# Patient Record
Sex: Male | Born: 1970 | Hispanic: No | Marital: Married | Smoking: Never smoker
Health system: Southern US, Community
[De-identification: ages and names within clinical notes are randomized; demographics above are authoritative.]

---

## 2009-07-26 ENCOUNTER — Emergency Department (HOSPITAL_COMMUNITY): Admission: EM | Admit: 2009-07-26 | Discharge: 2009-07-26 | Payer: Self-pay | Admitting: Emergency Medicine

## 2010-05-07 LAB — POCT CARDIAC MARKERS
CKMB, poc: <1 ng/mL — ABNORMAL LOW (ref 1.0–8.0)
Myoglobin, poc: 123 ng/mL (ref 12–200)
Troponin i, poc: <0.05 ng/mL (ref 0.00–0.09)

## 2010-05-07 LAB — POCT I-STAT, CHEM 8
BUN: 12 mg/dL (ref 6–23)
Calcium, Ion: 1.12 mmol/L (ref 1.12–1.32)
Creatinine, Ser: 1 mg/dL (ref 0.4–1.5)
HCT: 47 % (ref 39.0–52.0)
Potassium: 4 mEq/L (ref 3.5–5.1)
Sodium: 140 mEq/L (ref 135–145)

## 2011-07-16 IMAGING — CR DG CHEST 2V
2 series · 2 of 2 positions shown · non-contrast
Comparison: None

CLINICAL DATA: Chest pain.

CHEST - 2 VIEW

[w chest pa]
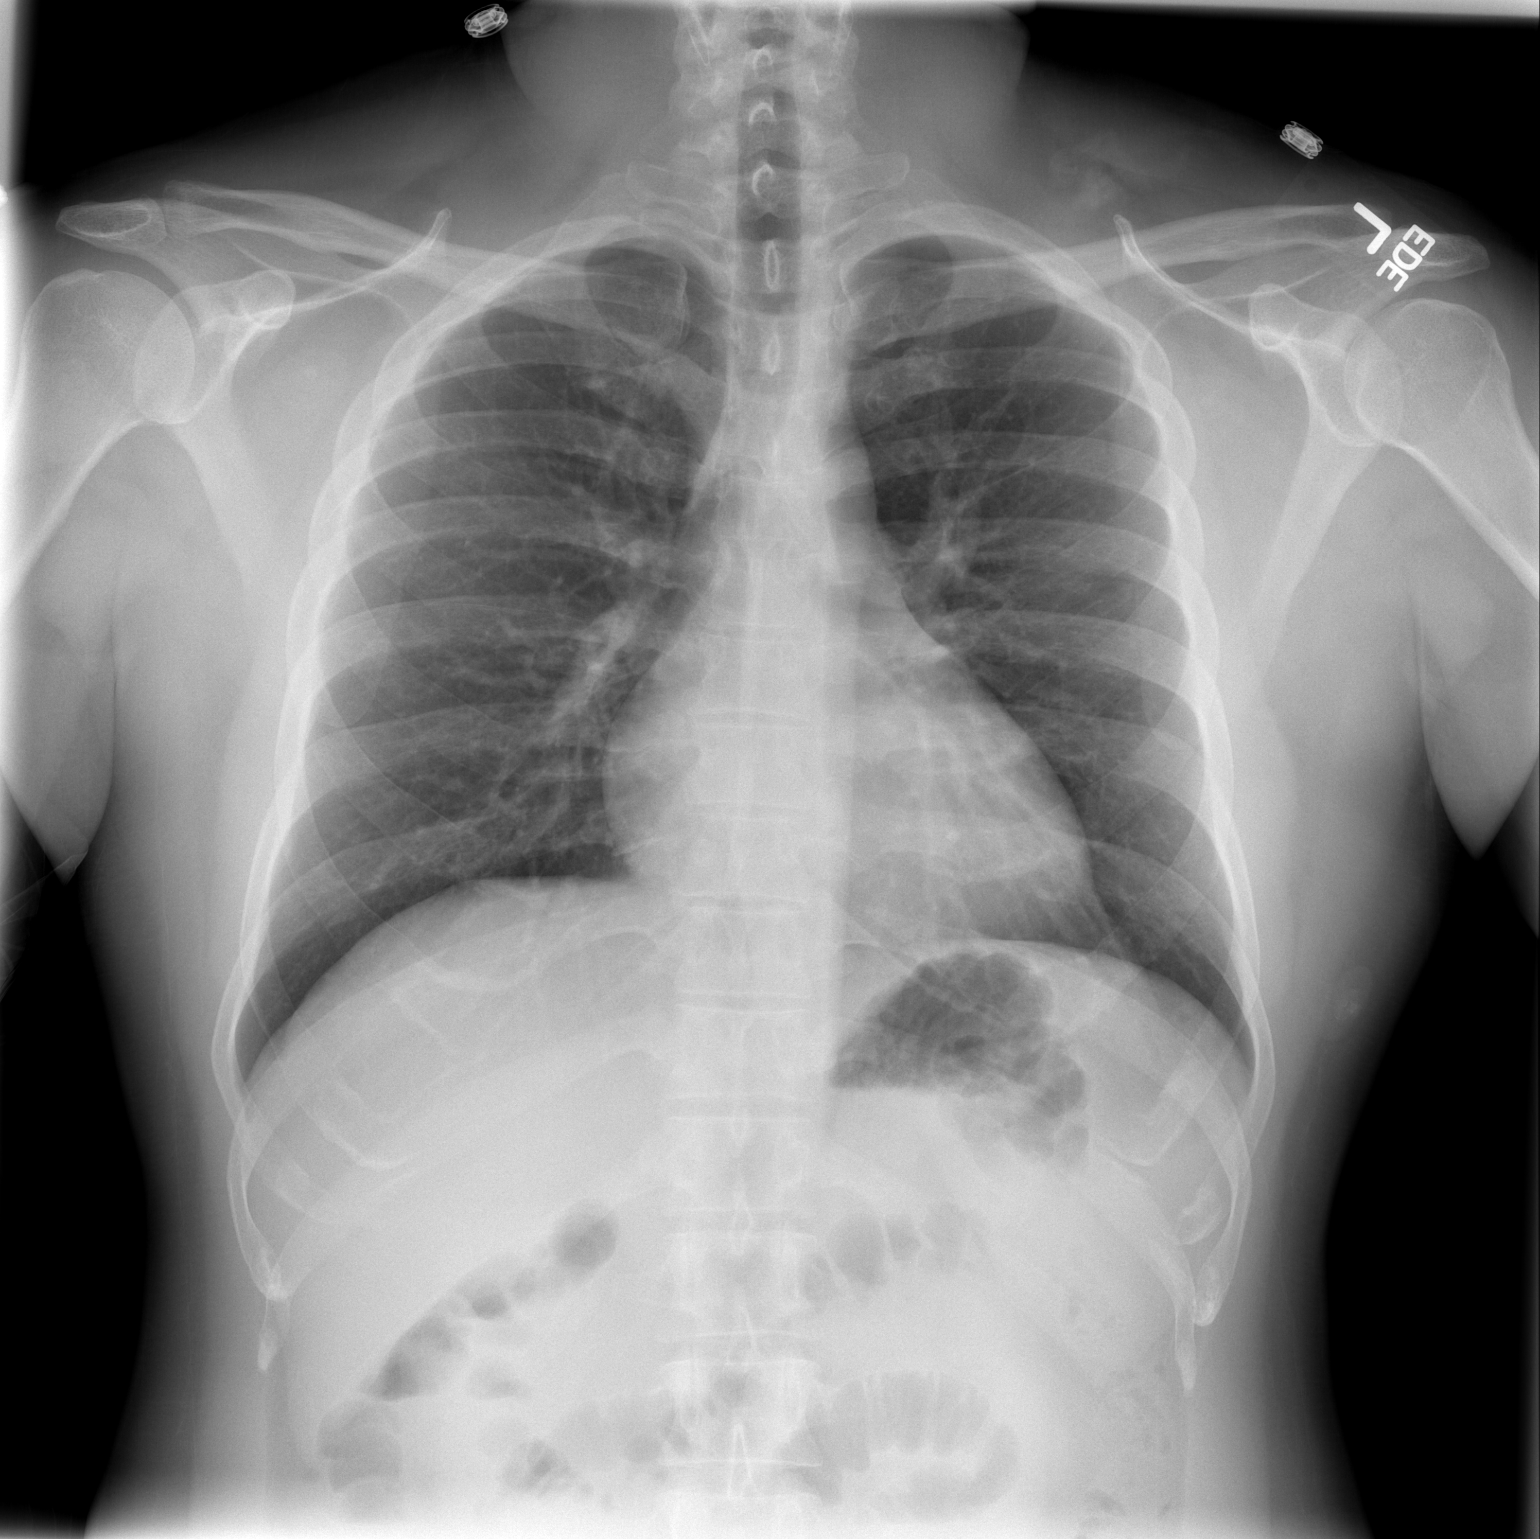

[w chest lat]
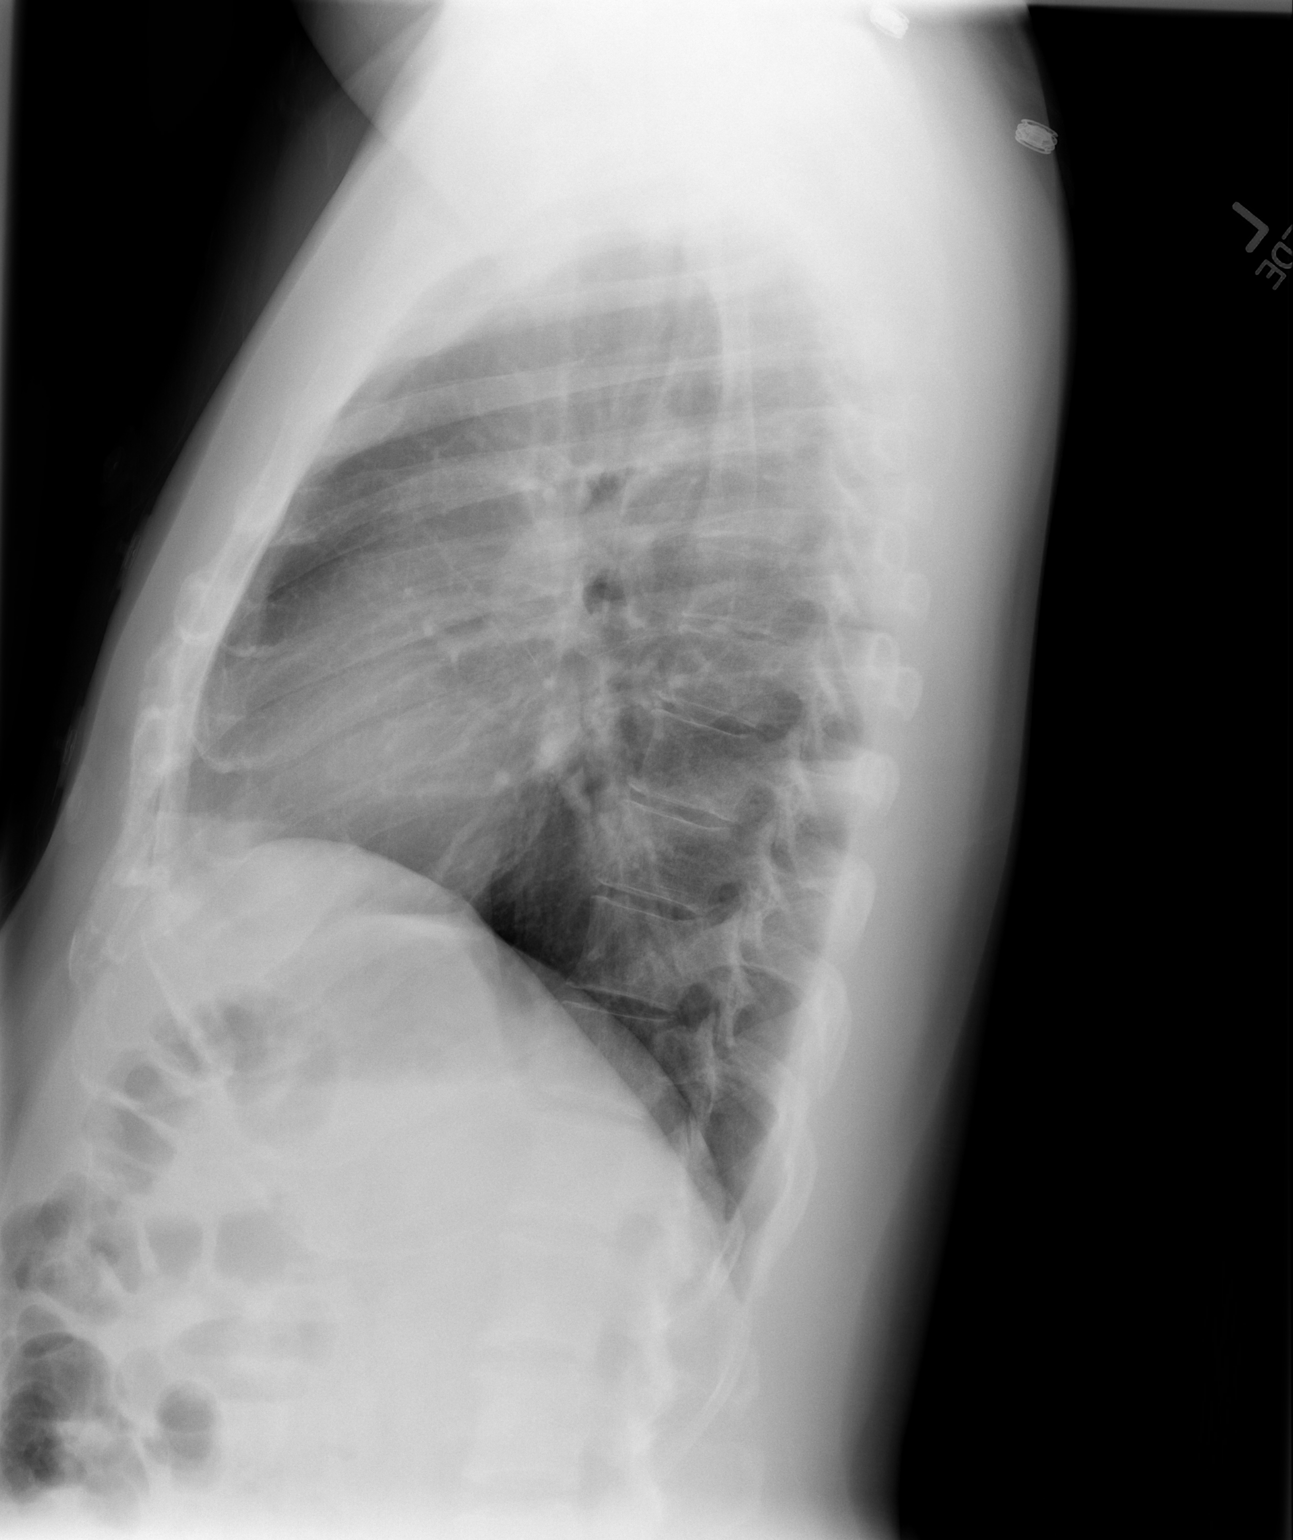

[2 of 2 positions shown; findings below may reference images not displayed]

FINDINGS: The heart size and mediastinal contours are within normal
limits.  Both lungs are clear.  The visualized skeletal structures
are unremarkable.
IMPRESSION: No active disease.

## 2014-03-12 ENCOUNTER — Encounter (HOSPITAL_COMMUNITY): Payer: Self-pay | Admitting: Nurse Practitioner

## 2014-03-12 ENCOUNTER — Emergency Department (HOSPITAL_COMMUNITY)
Admission: EM | Admit: 2014-03-12 | Discharge: 2014-03-12 | Disposition: A | Discharge status: Home / Self Care | Payer: BLUE CROSS/BLUE SHIELD | Attending: Emergency Medicine | Admitting: Emergency Medicine

## 2014-03-12 ENCOUNTER — Emergency Department (HOSPITAL_COMMUNITY): Payer: BLUE CROSS/BLUE SHIELD

## 2014-03-12 DIAGNOSIS — Y288XXA Contact with other sharp object, undetermined intent, initial encounter: Secondary | ICD-10-CM | POA: Diagnosis not present

## 2014-03-12 DIAGNOSIS — S61219A Laceration without foreign body of unspecified finger without damage to nail, initial encounter: Secondary | ICD-10-CM

## 2014-03-12 DIAGNOSIS — Y998 Other external cause status: Secondary | ICD-10-CM | POA: Insufficient documentation

## 2014-03-12 DIAGNOSIS — Z23 Encounter for immunization: Secondary | ICD-10-CM | POA: Diagnosis not present

## 2014-03-12 DIAGNOSIS — Y9389 Activity, other specified: Secondary | ICD-10-CM | POA: Insufficient documentation

## 2014-03-12 DIAGNOSIS — S61213A Laceration without foreign body of left middle finger without damage to nail, initial encounter: Secondary | ICD-10-CM | POA: Insufficient documentation

## 2014-03-12 DIAGNOSIS — Y9289 Other specified places as the place of occurrence of the external cause: Secondary | ICD-10-CM | POA: Diagnosis not present

## 2014-03-12 MED ORDER — LIDOCAINE HCL (PF) 1 % IJ SOLN
10.0000 mL | Freq: Once | INTRAMUSCULAR | Status: AC
  Administered 2014-03-12: 10 mL
  Filled 2014-03-12: qty 10

## 2014-03-12 MED ORDER — TETANUS-DIPHTH-ACELL PERTUSSIS 5-2.5-18.5 LF-MCG/0.5 IM SUSP
0.5000 mL | Freq: Once | INTRAMUSCULAR | Status: AC
  Administered 2014-03-12: 0.5 mL via INTRAMUSCULAR
  Filled 2014-03-12: qty 0.5

## 2014-03-12 NOTE — ED Provider Notes (Signed)
CSN: [id number]     Arrival date & time 03/12/14  1413 History  This chart was scribed for non-physician practitioner, Santiago Glad, PA-C working with Flint Melter, MD by Luisa Dago, ED scribe. This patient was seen in room TR07C/TR07C and the patient's care was started at 2:40 PM.    Chief Complaint  Patient presents with  . Laceration   The history is provided by the patient and medical records. No language interpreter was used.   HPI Comments: Bryan Guzman is a 44 y.o. male who presents to the Emergency Department complaining of a laceration to left middle finger that occurred today at 1:30 pm. Pt states that he was using a box cutter when he accidentally cut the proximal portion of his left middle finger. He reports washing the wound with water and has applied neosporin to it. Bleeding is controlled. No paraesthesia. He does not recall the date of his last tetanus vaccine. He has full movement of the finger.       No past medical history on file. No past surgical history on file. No family history on file. History  Substance Use Topics  . Smoking status: Not on file  . Smokeless tobacco: Not on file  . Alcohol Use: Not on file    Review of Systems  Constitutional: Negative for fever and chills.  Respiratory: Negative for shortness of breath and wheezing.   Skin: Positive for wound. Negative for rash.  Neurological: Negative for numbness.      Allergies  Review of patient's allergies indicates not on file.  Home Medications   Prior to Admission medications   Not on File   BP 129/72 mmHg  Pulse 97  Temp(Src) 97.5 F (36.4 C) (Oral)  Resp 18  Ht  (1.6 m)  Wt 150 lb (68.04 kg)  BMI 26.58 kg/m2  SpO2 97%  Physical Exam  Constitutional: He is oriented to person, place, and time. He appears well-developed and well-nourished. No distress.  HENT:  Head: Normocephalic and atraumatic.  Eyes: Conjunctivae and EOM are normal.  Neck: Neck supple.   Cardiovascular: Normal rate, regular rhythm and normal heart sounds.  Exam reveals no gallop and no friction rub.   No murmur heard.  Good capillary refill.   Pulmonary/Chest: Effort normal and breath sounds normal. No respiratory distress. He has no wheezes. He has no rales.  Musculoskeletal: Normal range of motion.  1.5 cm linear lac of the left middle finger in-between the left PIP and MCP along the radial aspect. No active bleeding. Able to fully flex and extend the finger.  Able to put the hand in a fist.    Neurological: He is alert and oriented to person, place, and time.  Muscle strength of the left middle finger is 5/5 at the level of the MCP, PIP, and DIP.   Distal sensation of the left middle finger is intact.   Skin: Skin is warm and dry.  Psychiatric: He has a normal mood and affect. His behavior is normal.  Nursing note and vitals reviewed.   ED Course  Procedures (including critical care time)  DIAGNOSTIC STUDIES: Oxygen Saturation is 97% on RA, normal by my interpretation.    COORDINATION OF CARE: 2:46 PM- Pt advised of plan for treatment and pt agrees.   Imaging Review Dg Finger Middle Left  03/12/2014   CLINICAL DATA:  Finger laceration middle finger  EXAM: LEFT MIDDLE FINGER 2+V  COMPARISON:  None.  FINDINGS: Three views of the left  third finger submitted. No acute fracture or subluxation. No radiopaque foreign body.  IMPRESSION: Negative.   Electronically Signed   By: Natasha MeadLiviu  Pop M.D.   On: 03/12/2014 15:29    LACERATION REPAIR Performed by: Santiago GladLaisure, Earvin Blazier Authorized by: Santiago GladLaisure, Jhaniya Briski Consent: Verbal consent obtained. Risks and benefits: risks, benefits and alternatives were discussed Consent given by: patient Patient identity confirmed: provided demographic data Prepped and Draped in normal sterile fashion Wound explored  Laceration Location: left middle finger  Laceration Length: 1.5 cm  No Foreign Bodies seen or palpated  Anesthesia:  Digital  block  Local anesthetic: lidocaine 2% without epinephrine  Anesthetic total: 5 ml  Irrigation method: syringe Amount of cleaning: standard  Skin closure: 4-0 Prolene  Number of sutures: 3  Technique: simple interrupted  Patient tolerance: Patient tolerated the procedure well with no immediate complications.  MDM   Final diagnoses:  None   Patient presenting with a laceration of his finger.  Xray negative.  No apparent tendon involvement.  Neurovascularly intact.  Tetanus updated in the ED.  Laceration sutured without difficulty.  Stable for discharge.  Return precautions given.     Santiago GladHeather Lela Murfin, PA-C 03/13/14 1657  Flint MelterElliott L Wentz, MD 03/13/14 [phone]

## 2014-03-12 NOTE — ED Notes (Signed)
He lacerated proximal portion of L middle finger with a box cutter. He washed with water and applied neosporin and clean dressing to control bleeding. No active bleeding now. Cms intact

## 2018-04-09 ENCOUNTER — Other Ambulatory Visit: Payer: Self-pay

## 2018-04-09 ENCOUNTER — Ambulatory Visit (HOSPITAL_COMMUNITY)
Admission: EM | Admit: 2018-04-09 | Discharge: 2018-04-09 | Disposition: A | Discharge status: Home / Self Care | Payer: BLUE CROSS/BLUE SHIELD | Attending: Family Medicine | Admitting: Family Medicine

## 2018-04-09 ENCOUNTER — Encounter (HOSPITAL_COMMUNITY): Payer: Self-pay | Admitting: Emergency Medicine

## 2018-04-09 DIAGNOSIS — H5789 Other specified disorders of eye and adnexa: Secondary | ICD-10-CM | POA: Diagnosis not present

## 2018-04-09 NOTE — ED Provider Notes (Signed)
[facility]    CSN: [id number] Arrival date & time: 04/09/18  9373     History   Chief Complaint Chief Complaint  Patient presents with  . Eye Problem    HPI Abelino Guzowski is a 48 y.o. male. He is presenting with bilateral eye irritation. Symptoms started this morning. Denies any trauma or foreign body in the eye. Denies any discharge. Has some tenderness on the lower eyelid bilaterally. Coworker had conjunctivitis recently. Denis changes in his vision.   HPI  History reviewed. No pertinent past medical history.  There are no active problems to display for this patient.   History reviewed. No pertinent surgical history.     Home Medications    Prior to Admission medications   Medication Sig Start Date End Date Taking? Authorizing Provider  acetaminophen (TYLENOL) 500 MG tablet Take 500 mg by mouth every 6 (six) hours as needed for mild pain.    [provider]    Family History Family History  Problem Relation Age of Onset  . Diabetes Mother     Social History Social History   Tobacco Use  . Smoking status: Never Smoker  Substance Use Topics  . Alcohol use: No  . Drug use: No     Allergies   Patient has no known allergies.   Review of Systems Review of Systems  Constitutional: Negative for fever.  HENT: Negative for congestion.   Eyes: Positive for pain.  Respiratory: Negative for cough.   Cardiovascular: Negative for chest pain.  Gastrointestinal: Negative for abdominal pain.  Musculoskeletal: Negative for back pain.  Skin: Negative for color change.  Psychiatric/Behavioral: Negative for agitation.     Physical Exam Triage Vital Signs ED Triage Vitals [04/09/18 1829]  Enc Vitals Group     BP      Pulse      Resp      Temp      Temp src      SpO2      Weight      Height      Head Circumference      Peak Flow      Pain Score 1     Pain Loc      Pain Edu?      Excl. in GC?    No data found.  Updated Vital  Signs BP 136/75 (BP Location: Right Arm)   Pulse 93   Temp 98 F (36.7 C) (Oral)   Resp 18   SpO2 98%   Visual Acuity Right Eye Distance:   Left Eye Distance:   Bilateral Distance:    Right Eye Near:   Left Eye Near:    Bilateral Near:     Physical Exam Gen: NAD, alert, cooperative with exam, well-appearing ENT: normal lips, normal nasal mucosa,  Eye: normal EOM, normal conjunctiva and lids, TTP of the lower eyelid b/l, no stye present. No discharge  CV:  no edema, +2 pedal pulses   Resp: no accessory muscle use, non-labored,  Skin: no rashes, no areas of induration  Neuro: normal tone, normal sensation to touch Psych:  normal insight, alert and oriented MSK: normal gait, normal strength   UC Treatments / Results  Labs (all labs ordered are listed, but only abnormal results are displayed) Labs Reviewed - No data to display  EKG None  Radiology No results found.  Procedures Procedures (including critical care time)  Medications Ordered in UC Medications - No data to display  Initial Impression /  Assessment and Plan / UC Course  I have reviewed the triage vital signs and the nursing notes.  Pertinent labs & imaging results that were available during my care of the patient were reviewed by me and considered in my medical decision making (see chart for details).     Mr. Caspar is a 48 year old male is presenting with eye irritation.  His symptoms started today.  Did not demonstrate any signs of conjunctivitis on exam.  Has normal ocular movements and  no changes in vision.  Provided assurance and counseled on if and when to follow-up.  Final Clinical Impressions(s) / UC Diagnoses   Final diagnoses:  Eye irritation     Discharge Instructions     Please practice good hand hygiene and do not share towels or any drinks  Please try warm compress if a stye develops  You can try artificial tears  Please follow up if your symptoms change or worsen.     ED  Prescriptions    None     Controlled Substance Prescriptions [place] Controlled Substance Registry consulted? Not Applicable   Myra Rude, MD 04/09/18 [phone]

## 2018-04-09 NOTE — ED Triage Notes (Signed)
This morning, left eye watery and aching, now both eyes are watery and aching.  No known injury  Does not wear contacts

## 2018-04-09 NOTE — Discharge Instructions (Signed)
Please practice good hand hygiene and do not share towels or any drinks  Please try warm compress if a stye develops  You can try artificial tears  Please follow up if your symptoms change or worsen.

## 2020-04-03 DIAGNOSIS — M79645 Pain in left finger(s): Secondary | ICD-10-CM | POA: Diagnosis not present

## 2020-04-03 DIAGNOSIS — R431 Parosmia: Secondary | ICD-10-CM | POA: Diagnosis not present

## 2020-05-08 DIAGNOSIS — Z125 Encounter for screening for malignant neoplasm of prostate: Secondary | ICD-10-CM | POA: Diagnosis not present

## 2020-05-08 DIAGNOSIS — Z13220 Encounter for screening for lipoid disorders: Secondary | ICD-10-CM | POA: Diagnosis not present

## 2020-05-08 DIAGNOSIS — Z0000 Encounter for general adult medical examination without abnormal findings: Secondary | ICD-10-CM | POA: Diagnosis not present

## 2020-05-15 DIAGNOSIS — Z0000 Encounter for general adult medical examination without abnormal findings: Secondary | ICD-10-CM | POA: Diagnosis not present

## 2020-05-15 DIAGNOSIS — M79645 Pain in left finger(s): Secondary | ICD-10-CM | POA: Diagnosis not present

## 2020-05-16 DIAGNOSIS — M79642 Pain in left hand: Secondary | ICD-10-CM | POA: Diagnosis not present

## 2020-05-16 DIAGNOSIS — M1990 Unspecified osteoarthritis, unspecified site: Secondary | ICD-10-CM | POA: Diagnosis not present

## 2020-05-16 DIAGNOSIS — M79641 Pain in right hand: Secondary | ICD-10-CM | POA: Diagnosis not present

## 2020-05-16 DIAGNOSIS — M79645 Pain in left finger(s): Secondary | ICD-10-CM | POA: Diagnosis not present

## 2020-05-16 DIAGNOSIS — M25741 Osteophyte, right hand: Secondary | ICD-10-CM | POA: Diagnosis not present

## 2020-05-16 DIAGNOSIS — M2550 Pain in unspecified joint: Secondary | ICD-10-CM | POA: Diagnosis not present

## 2021-06-08 DIAGNOSIS — Z125 Encounter for screening for malignant neoplasm of prostate: Secondary | ICD-10-CM | POA: Diagnosis not present

## 2021-06-08 DIAGNOSIS — Z0000 Encounter for general adult medical examination without abnormal findings: Secondary | ICD-10-CM | POA: Diagnosis not present

## 2021-06-11 DIAGNOSIS — M79645 Pain in left finger(s): Secondary | ICD-10-CM | POA: Diagnosis not present

## 2021-06-11 DIAGNOSIS — Z0000 Encounter for general adult medical examination without abnormal findings: Secondary | ICD-10-CM | POA: Diagnosis not present

## 2021-07-04 DIAGNOSIS — Z1211 Encounter for screening for malignant neoplasm of colon: Secondary | ICD-10-CM | POA: Diagnosis not present

## 2021-07-04 DIAGNOSIS — M1990 Unspecified osteoarthritis, unspecified site: Secondary | ICD-10-CM | POA: Diagnosis not present

## 2021-08-02 DIAGNOSIS — D123 Benign neoplasm of transverse colon: Secondary | ICD-10-CM | POA: Diagnosis not present

## 2021-08-02 DIAGNOSIS — Z1211 Encounter for screening for malignant neoplasm of colon: Secondary | ICD-10-CM | POA: Diagnosis not present

## 2021-08-02 DIAGNOSIS — K635 Polyp of colon: Secondary | ICD-10-CM | POA: Diagnosis not present
